# Patient Record
Sex: Male | Born: 2000 | Race: White | Hispanic: No | Marital: Single | Smoking: Never smoker
Health system: Southern US, Community
[De-identification: ages and names within clinical notes are randomized; demographics above are authoritative.]

---
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
## 2020-02-13 ENCOUNTER — Other Ambulatory Visit: Payer: Self-pay

## 2020-02-13 ENCOUNTER — Ambulatory Visit (INDEPENDENT_AMBULATORY_CARE_PROVIDER_SITE_OTHER): Payer: 59 | Admitting: Pediatrics

## 2020-02-13 DIAGNOSIS — Z23 Encounter for immunization: Secondary | ICD-10-CM

## 2020-02-13 NOTE — Progress Notes (Signed)
Flu vaccine per orders. Indications, contraindications and side effects of vaccine/vaccines discussed with parent and parent verbally expressed understanding and also agreed with the administration of vaccine/vaccines as ordered above today.Handout (VIS) given for each vaccine at this visit. ° °

## 2020-11-25 ENCOUNTER — Encounter: Payer: Self-pay | Admitting: Pediatrics

## 2020-11-25 ENCOUNTER — Other Ambulatory Visit: Payer: Self-pay

## 2020-11-25 ENCOUNTER — Ambulatory Visit (INDEPENDENT_AMBULATORY_CARE_PROVIDER_SITE_OTHER): Payer: 59 | Admitting: Pediatrics

## 2020-11-25 VITALS — BP 106/78 | Ht 66.75 in | Wt 127.6 lb

## 2020-11-25 DIAGNOSIS — Z00129 Encounter for routine child health examination without abnormal findings: Secondary | ICD-10-CM | POA: Insufficient documentation

## 2020-11-25 DIAGNOSIS — Z68.41 Body mass index (BMI) pediatric, 5th percentile to less than 85th percentile for age: Secondary | ICD-10-CM | POA: Diagnosis not present

## 2020-11-25 DIAGNOSIS — Z0000 Encounter for general adult medical examination without abnormal findings: Secondary | ICD-10-CM

## 2020-11-25 NOTE — Patient Instructions (Signed)
Preventive Care 18-21 Years Old, Male Preventive care refers to lifestyle choices and visits with your health care provider that can promote health and wellness. At this stage in your life, you may start seeing a primary care physician instead of a pediatrician. It is important to take responsibility for your health and well-being. Preventive care for young adults includes: A yearly physical exam. This is also called an annual wellness visit. Regular dental and eye exams. Immunizations. Screening for certain conditions. Healthy lifestyle choices, such as: Eating a healthy diet. Getting regular exercise. Not using drugs or products that contain nicotine and tobacco. Limiting alcohol use. What can I expect for my preventive care visit? Physical exam Your health care provider may check your: Height and weight. These may be used to calculate your BMI (body mass index). BMI is a measurement that tells if you are at a healthy weight. Heart rate and blood pressure. Body temperature. Skin for abnormal spots. Counseling Your health care provider may ask you questions about your: Past medical problems. Family's medical history. Alcohol, tobacco, and drug use. Home life and relationship well-being. Access to firearms. Emotional well-being. Diet, exercise, and sleep habits. Sexual activity and sexual health. What immunizations do I need?  Vaccines are usually given at various ages, according to a schedule. Your health care provider will recommend vaccines for you based on your age, medicalhistory, and lifestyle or other factors, such as travel or where you work. What tests do I need? Blood tests Lipid and cholesterol levels. These may be checked every 5 years starting at age 20. Hepatitis C test. Hepatitis B test. Screening Genital exam to check for testicular cancer or hernias. STD (sexually transmitted disease) testing, if you are at risk. Other tests Tuberculosis skin test. Vision and  hearing tests. Skin exam. Talk with your health care provider about your test results, treatment options,and if necessary, the need for more tests. Follow these instructions at home: Eating and drinking  Eat a healthy diet that includes fresh fruits and vegetables, whole grains, lean protein, and low-fat dairy products. Drink enough fluid to keep your urine pale yellow. Do not drink alcohol if: Your health care provider tells you not to drink. You are under the legal drinking age. In the [place] the legal drinking age is 21. If you drink alcohol: Limit how much you use to 0-2 drinks a day. Be aware of how much alcohol is in your drink. In the [place] one drink equals one 12 oz bottle of beer (355 mL), one 5 oz glass of wine (148 mL), or one 1 oz glass of hard liquor (44 mL).  Lifestyle Take daily care of your teeth and gums. Brush your teeth every morning and night with fluoride toothpaste. Floss one time each day. Stay active. Exercise for at least 30 minutes 5 or more days of the week. Do not use any products that contain nicotine or tobacco, such as cigarettes, e-cigarettes, and chewing tobacco. If you need help quitting, ask your health care provider. Do not use drugs. If you are sexually active, practice safe sex. Use a condom or other form of protection to prevent STIs (sexually transmitted infections). Find healthy ways to cope with stress, such as: Meditation, yoga, or listening to music. Journaling. Talking to a trusted person. Spending time with friends and family. Safety Always wear your seat belt while driving or riding in a vehicle. Do not drive: If you have been drinking alcohol. Do not ride with someone who has been drinking.   When you are tired or distracted. While texting. Wear a helmet and other protective equipment during sports activities. If you have firearms in your house, make sure you follow all gun safety procedures. Seek help if you have been bullied,  physically abused, or sexually abused. Use the Internet responsibly to avoid dangers, such as online bullying and online sex predators. What's next? Go to your health care provider once a year for an annual wellness visit. Ask your health care provider how often you should have your eyes and teeth checked. Stay up to date on all vaccines. This information is not intended to replace advice given to you by your health care provider. Make sure you discuss any questions you have with your healthcare provider. Document Revised: 01/11/2019 Document Reviewed: 04/21/2018 Elsevier Patient Education  2022 [employer].

## 2020-11-25 NOTE — Progress Notes (Signed)
Adolescent Well Care Visit Patrick Livingston is a 20 y.o. male who is here for well care.    PCP:  [name], MD   History was provided by the patient and mother.  Confidentiality was discussed with the patient and, if applicable, with caregiver as well.  PCP:  [name], MD   History was provided by the patient and mother.  Current Issues: Current concerns include none.   Nutrition: Nutrition/Eating Behaviors: good Adequate calcium in diet?: yes Supplements/ Vitamins: yes  Exercise/ Media: Play any Sports?/ Exercise: yes Screen Time:  < 2 hours Media Rules or Monitoring?: yes  Sleep:  Sleep: 8-10 hours  Social Screening: Lives with:  parents Parental relations:  good Activities, Work, and Regulatory affairs officer?: yes Concerns regarding behavior with peers?  no Stressors of note: no  Education:  School Grade: [employer] School performance: doing well; no concerns School Behavior: doing well; no concerns  Menstruation:   No LMP for male patient.   Tobacco?  no Secondhand smoke exposure?  no Drugs/ETOH?  no  Sexually Active?  no     Safe at home, in school & in relationships?  Yes Safe to self?  Yes   Screenings: Patient has a dental home: yes  The following topics were discussed and advice provided to the patient: eating habits, exercise habits, safety equipment use, bullying, abuse and/or trauma, weapon use, tobacco use, other substance use, reproductive health, and mental health.   Any issues identified were addressed and counseling provided those as needed.    Additional topics were addressed as anticipatory guidance.   PHQ-9 completed and results indicated --no risk   Physical Exam:  Vitals:   11/25/20 1132  BP: 106/78  Weight: 127 lb 9.6 oz (57.9 kg)  Height: 5' 6.75" (1.695 m)   BP 106/78   Ht 5' 6.75" (1.695 m)   Wt 127 lb 9.6 oz (57.9 kg)   BMI 20.13 kg/m  Body mass index: body mass index is 20.13 kg/m. Blood pressure percentiles are not  available for patients who are 18 years or older.  Hearing Screening   500Hz  1000Hz  2000Hz  3000Hz  4000Hz   Right ear 20 20 20 20 20   Left ear 20 20 20 20 20    Vision Screening   Right eye Left eye Both eyes  Without correction 10/10 10/12.5   With correction       General Appearance:   alert, oriented, no acute distress and well nourished  HENT: Normocephalic, no obvious abnormality, conjunctiva clear  Mouth:   Normal appearing teeth, no obvious discoloration, dental caries, or dental caps  Neck:   Supple; thyroid: no enlargement, symmetric, no tenderness/mass/nodules  Chest normal  Lungs:   Clear to auscultation bilaterally, normal work of breathing  Heart:   Regular rate and rhythm, S1 and S2 normal, no murmurs;   Abdomen:   Soft, non-tender, no mass, or organomegaly  GU normal male genitals, no testicular masses or hernia  Musculoskeletal:   Tone and strength strong and symmetrical, all extremities               Lymphatic:   No cervical adenopathy  Skin/Hair/Nails:   Skin warm, dry and intact, no rashes, no bruises or petechiae  Neurologic:   Strength, gait, and coordination normal and age-appropriate     Assessment and Plan:   Well adolescent male   BMI is appropriate for age  Hearing screening result:normal Vision screening result: normal     Return in about 1 year (around  11/25/2021).Marland Kitchen  [name], MD

## 2020-11-26 ENCOUNTER — Other Ambulatory Visit: Payer: Self-pay | Admitting: Pediatrics

## 2021-06-22 ENCOUNTER — Emergency Department (HOSPITAL_BASED_OUTPATIENT_CLINIC_OR_DEPARTMENT_OTHER)
Admission: EM | Admit: 2021-06-22 | Discharge: 2021-06-22 | Disposition: A | Discharge status: Home / Self Care | Payer: 59 | Attending: Emergency Medicine | Admitting: Emergency Medicine

## 2021-06-22 ENCOUNTER — Encounter (HOSPITAL_BASED_OUTPATIENT_CLINIC_OR_DEPARTMENT_OTHER): Payer: Self-pay

## 2021-06-22 ENCOUNTER — Other Ambulatory Visit: Payer: Self-pay

## 2021-06-22 DIAGNOSIS — S61211A Laceration without foreign body of left index finger without damage to nail, initial encounter: Secondary | ICD-10-CM | POA: Insufficient documentation

## 2021-06-22 DIAGNOSIS — S6992XA Unspecified injury of left wrist, hand and finger(s), initial encounter: Secondary | ICD-10-CM | POA: Diagnosis present

## 2021-06-22 DIAGNOSIS — W268XXA Contact with other sharp object(s), not elsewhere classified, initial encounter: Secondary | ICD-10-CM | POA: Insufficient documentation

## 2021-06-22 NOTE — ED Triage Notes (Signed)
Patient here POV from Home with Laceration.  Patient was cutting food at Home for [facility] party when he lacerated his Distal Left Index.   Bleeding Controlled. Irrigated in Triage.   NAD Noted during Triage. Ambulatory.

## 2021-06-22 NOTE — ED Provider Notes (Signed)
[facility] EMERGENCY DEPT Provider Note   CSN: [id number] Arrival date & time: 06/22/21  1854     History  Chief Complaint  Patient presents with   Laceration    Patrick Livingston is a 21 y.o. male with no significant past medical history who presents with concern for laceration to the distal tip of the left index finger while cutting avocados at a [employer] party earlier tonight.  Patient reports the bleeding controlled on arrival, patient reports that he received his tetanus shot within the last 2 years secondary to college.  Patient denies significant pain, numbness, tingling at this time.  Patient does not take any blood thinners.   Laceration     Home Medications Prior to Admission medications   Not on File      Allergies    Lactose intolerance (gi)    Review of Systems   Review of Systems  Skin:  Positive for wound.  All other systems reviewed and are negative.  Physical Exam Updated Vital Signs BP 128/77 (BP Location: Right Arm)    Pulse (!) 55    Temp 98.3 F (36.8 C)    Resp 18    Ht 5\' 7"  (1.702 m)    Wt 59 kg    SpO2 94%    BMI 20.36 kg/m  Physical Exam Vitals and nursing note reviewed.  Constitutional:      General: He is not in acute distress.    Appearance: Normal appearance.  HENT:     Head: Normocephalic and atraumatic.  Eyes:     General:        Right eye: No discharge.        Left eye: No discharge.  Cardiovascular:     Rate and Rhythm: Normal rate and regular rhythm.     Pulses: Normal pulses.  Pulmonary:     Effort: Pulmonary effort is normal. No respiratory distress.  Musculoskeletal:        General: No deformity.  Skin:    General: Skin is warm and dry.     Capillary Refill: Capillary refill takes less than 2 seconds.     Comments: Patient with shallow linear laceration at the distal tip of the left index finger on the volar side.  Approximately 2.5 cm in length.  No retained foreign body.  Entire depth of wound visualized,  no evidence of nailbed involvement.  Neurological:     Mental Status: He is alert and oriented to person, place, and time.  Psychiatric:        Mood and Affect: Mood normal.        Behavior: Behavior normal.    ED Results / Procedures / Treatments   Labs (all labs ordered are listed, but only abnormal results are displayed) Labs Reviewed - No data to display  EKG None  Radiology No results found.  Procedures .Marland KitchenLaceration Repair  Date/Time: 06/22/2021 8:25 PM Performed by: Anselmo Pickler, PA-C Authorized by: Anselmo Pickler, PA-C   Consent:    Consent obtained:  Verbal   Consent given by:  Patient   Risks, benefits, and alternatives were discussed: yes     Risks discussed:  Infection, pain, poor cosmetic result and poor wound healing   Alternatives discussed:  No treatment Universal protocol:    Procedure explained and questions answered to patient or proxy's satisfaction: yes     Patient identity confirmed:  Verbally with patient Anesthesia:    Anesthesia method:  None Laceration details:  Location:  Finger   Finger location:  L index finger   Length (cm):  2.6   Depth (mm):  2 Exploration:    Wound exploration: wound explored through full range of motion and entire depth of wound visualized   Treatment:    Area cleansed with:  Povidone-iodine and saline   Amount of cleaning:  Standard   Irrigation solution:  Sterile saline Skin repair:    Repair method:  Tissue adhesive Approximation:    Approximation:  Close Repair type:    Repair type:  Simple Post-procedure details:    Dressing:  Open (no dressing)   Procedure completion:  Tolerated    Medications Ordered in ED Medications - No data to display  ED Course/ Medical Decision Making/ A&P                           Medical Decision Making  This is an overall well-appearing 21 year old male who complains of laceration to the distal left index finger on the volar surface.  On examination  patient has a small around 2.6 cm linear laceration that is approximately 2 mm deep.  It is not actively bleeding.  I fully explored the wound through range of motion, and irrigated.  No foreign body noted.  Patient is up-to-date on his tetanus.  Discussed with patient who agrees to repair with tissue adhesive.  See procedure note above for further information.  Patient instructed to watch out for signs of infection, use ibuprofen and Tylenol for pain control.  Patient discharged in stable condition, return precautions given. Final Clinical Impression(s) / ED Diagnoses Final diagnoses:  Laceration of left index finger without foreign body without damage to nail, initial encounter    Rx / DC Orders ED Discharge Orders     None         [name] 06/22/21 2027    Lajean Saver, MD 06/24/21 1535

## 2021-06-22 NOTE — ED Notes (Signed)
Christian, PA-C in room w/pt now. 

## 2021-06-22 NOTE — ED Notes (Signed)
Dermabond given to the Provider.

## 2021-06-22 NOTE — Discharge Instructions (Addendum)
Please use Tylenol or ibuprofen for pain.  You may use 600 mg ibuprofen every 6 hours or 1000 mg of Tylenol every 6 hours.  You may choose to alternate between the 2.  This would be most effective.  Not to exceed 4 g of Tylenol within 24 hours.  Not to exceed 3200 mg ibuprofen 24 hours.  Please return if you notice any signs of infection, including increasing pain, redness, pus draining from the wound site.

## 2021-12-22 ENCOUNTER — Encounter: Payer: Self-pay | Admitting: Pediatrics

## 2023-01-19 ENCOUNTER — Encounter: Payer: Self-pay | Admitting: Pediatrics

## 2023-03-23 ENCOUNTER — Other Ambulatory Visit: Payer: Self-pay | Admitting: Physician Assistant

## 2023-03-23 ENCOUNTER — Ambulatory Visit
Admission: RE | Admit: 2023-03-23 | Discharge: 2023-03-23 | Disposition: A | Discharge status: Home / Self Care | Payer: 59 | Source: Ambulatory Visit | Attending: Physician Assistant | Admitting: Physician Assistant

## 2023-03-23 DIAGNOSIS — R079 Chest pain, unspecified: Secondary | ICD-10-CM

## 2023-03-25 ENCOUNTER — Other Ambulatory Visit (HOSPITAL_COMMUNITY): Payer: Self-pay | Admitting: Family Medicine

## 2023-03-25 DIAGNOSIS — E0590 Thyrotoxicosis, unspecified without thyrotoxic crisis or storm: Secondary | ICD-10-CM

## 2023-04-05 ENCOUNTER — Encounter (HOSPITAL_COMMUNITY)
Admission: RE | Admit: 2023-04-05 | Discharge: 2023-04-05 | Disposition: A | Discharge status: Home / Self Care | Payer: 59 | Source: Ambulatory Visit | Attending: Family Medicine | Admitting: Family Medicine

## 2023-04-05 DIAGNOSIS — E0590 Thyrotoxicosis, unspecified without thyrotoxic crisis or storm: Secondary | ICD-10-CM | POA: Diagnosis present

## 2023-04-05 MED ORDER — SODIUM IODIDE I-123 7.4 MBQ CAPS
436.0000 | ORAL_CAPSULE | Freq: Once | ORAL | Status: AC
  Administered 2023-04-05: 436 via ORAL

## 2023-04-06 ENCOUNTER — Encounter (HOSPITAL_COMMUNITY)
Admission: RE | Admit: 2023-04-06 | Discharge: 2023-04-06 | Disposition: A | Discharge status: Home / Self Care | Payer: 59 | Source: Ambulatory Visit | Attending: Family Medicine | Admitting: Family Medicine
